# Patient Record
Sex: Female | Born: 1970 | Race: White | Hispanic: No | Smoking: Never smoker
Health system: Southern US, Community
[De-identification: ages and names within clinical notes are randomized; demographics above are authoritative.]

## PROBLEM LIST (undated history)

## (undated) DIAGNOSIS — F419 Anxiety disorder, unspecified: Secondary | ICD-10-CM

## (undated) DIAGNOSIS — I10 Essential (primary) hypertension: Secondary | ICD-10-CM

## (undated) HISTORY — DX: Anxiety disorder, unspecified: F41.9

## (undated) HISTORY — DX: Essential (primary) hypertension: I10

---

## 2006-02-22 ENCOUNTER — Ambulatory Visit: Payer: Self-pay

## 2008-04-10 ENCOUNTER — Ambulatory Visit: Payer: Self-pay | Admitting: Internal Medicine

## 2010-05-17 ENCOUNTER — Ambulatory Visit: Payer: Self-pay | Admitting: Obstetrics and Gynecology

## 2011-06-07 ENCOUNTER — Ambulatory Visit: Payer: Self-pay | Admitting: Obstetrics and Gynecology

## 2012-08-06 ENCOUNTER — Ambulatory Visit: Payer: Self-pay | Admitting: Obstetrics and Gynecology

## 2013-08-07 ENCOUNTER — Ambulatory Visit: Payer: Self-pay | Admitting: Obstetrics and Gynecology

## 2014-01-12 DIAGNOSIS — I10 Essential (primary) hypertension: Secondary | ICD-10-CM | POA: Insufficient documentation

## 2014-01-13 ENCOUNTER — Ambulatory Visit: Payer: Self-pay

## 2014-01-27 ENCOUNTER — Ambulatory Visit: Payer: Self-pay

## 2014-01-27 HISTORY — PX: BREAST BIOPSY: SHX20

## 2014-01-28 LAB — PATHOLOGY REPORT

## 2015-02-25 ENCOUNTER — Other Ambulatory Visit: Payer: Self-pay | Admitting: Nurse Practitioner

## 2015-02-25 DIAGNOSIS — Z1231 Encounter for screening mammogram for malignant neoplasm of breast: Secondary | ICD-10-CM

## 2015-02-25 DIAGNOSIS — N649 Disorder of breast, unspecified: Secondary | ICD-10-CM

## 2015-03-01 ENCOUNTER — Ambulatory Visit
Admission: RE | Admit: 2015-03-01 | Discharge: 2015-03-01 | Disposition: A | Discharge status: Home / Self Care | Payer: BC Managed Care – PPO | Source: Ambulatory Visit | Attending: Nurse Practitioner | Admitting: Nurse Practitioner

## 2015-03-01 ENCOUNTER — Ambulatory Visit: Payer: Self-pay

## 2015-03-01 DIAGNOSIS — N649 Disorder of breast, unspecified: Secondary | ICD-10-CM | POA: Diagnosis not present

## 2015-03-01 DIAGNOSIS — Z1231 Encounter for screening mammogram for malignant neoplasm of breast: Secondary | ICD-10-CM

## 2015-11-02 ENCOUNTER — Other Ambulatory Visit: Payer: Self-pay | Admitting: Obstetrics and Gynecology

## 2015-11-02 DIAGNOSIS — Z1231 Encounter for screening mammogram for malignant neoplasm of breast: Secondary | ICD-10-CM

## 2015-11-03 ENCOUNTER — Other Ambulatory Visit: Payer: Self-pay | Admitting: Obstetrics and Gynecology

## 2015-11-03 DIAGNOSIS — N6002 Solitary cyst of left breast: Secondary | ICD-10-CM

## 2016-03-09 ENCOUNTER — Ambulatory Visit
Admission: RE | Admit: 2016-03-09 | Discharge: 2016-03-09 | Disposition: A | Discharge status: Home / Self Care | Payer: BC Managed Care – PPO | Source: Ambulatory Visit | Attending: Obstetrics and Gynecology | Admitting: Obstetrics and Gynecology

## 2016-03-09 DIAGNOSIS — N6002 Solitary cyst of left breast: Secondary | ICD-10-CM

## 2016-03-09 DIAGNOSIS — Z1231 Encounter for screening mammogram for malignant neoplasm of breast: Secondary | ICD-10-CM | POA: Insufficient documentation

## 2016-12-20 ENCOUNTER — Other Ambulatory Visit: Payer: Self-pay | Admitting: Obstetrics and Gynecology

## 2016-12-20 DIAGNOSIS — Z1231 Encounter for screening mammogram for malignant neoplasm of breast: Secondary | ICD-10-CM

## 2017-03-12 ENCOUNTER — Ambulatory Visit
Admission: RE | Admit: 2017-03-12 | Discharge: 2017-03-12 | Disposition: A | Discharge status: Home / Self Care | Payer: BC Managed Care – PPO | Source: Ambulatory Visit | Attending: Obstetrics and Gynecology | Admitting: Obstetrics and Gynecology

## 2017-03-12 DIAGNOSIS — Z1231 Encounter for screening mammogram for malignant neoplasm of breast: Secondary | ICD-10-CM | POA: Diagnosis present

## 2017-10-04 ENCOUNTER — Other Ambulatory Visit: Payer: Self-pay | Admitting: Nurse Practitioner

## 2017-10-04 DIAGNOSIS — F411 Generalized anxiety disorder: Secondary | ICD-10-CM

## 2017-10-04 MED ORDER — ALPRAZOLAM 0.25 MG PO TABS: 0.2500 mg | ORAL_TABLET | Freq: Two times a day (BID) | ORAL | 2 refills | Status: DC | PRN

## 2017-10-04 NOTE — Progress Notes (Signed)
Added alprazolam 0.25mg  twice daily as needed for anxiety. Sent new rx to [facility] [address].

## 2017-10-29 ENCOUNTER — Other Ambulatory Visit: Payer: Self-pay

## 2017-10-29 MED ORDER — VALSARTAN 160 MG PO TABS: 160.0000 mg | ORAL_TABLET | Freq: Every day | ORAL | 5 refills | Status: DC

## 2017-12-17 ENCOUNTER — Other Ambulatory Visit: Payer: Self-pay

## 2017-12-17 MED ORDER — VALSARTAN 160 MG PO TABS: 160.0000 mg | ORAL_TABLET | Freq: Every day | ORAL | 5 refills | Status: DC

## 2018-04-09 ENCOUNTER — Other Ambulatory Visit: Payer: Self-pay | Admitting: Obstetrics and Gynecology

## 2018-04-09 DIAGNOSIS — Z1231 Encounter for screening mammogram for malignant neoplasm of breast: Secondary | ICD-10-CM

## 2018-05-14 ENCOUNTER — Ambulatory Visit
Admission: RE | Admit: 2018-05-14 | Discharge: 2018-05-14 | Disposition: A | Discharge status: Home / Self Care | Payer: BC Managed Care – PPO | Source: Ambulatory Visit | Attending: Obstetrics and Gynecology | Admitting: Obstetrics and Gynecology

## 2018-05-14 DIAGNOSIS — Z1231 Encounter for screening mammogram for malignant neoplasm of breast: Secondary | ICD-10-CM | POA: Diagnosis not present

## 2018-12-21 IMAGING — MG MM DIGITAL SCREENING BILAT W/ TOMO W/ CAD
8 series · 9 of 24 positions shown · non-contrast
Comparison: Previous exam(s).

CLINICAL DATA: Screening.

EXAM:
DIGITAL SCREENING BILATERAL MAMMOGRAM WITH TOMO AND CAD

[R MLO synth-2D]
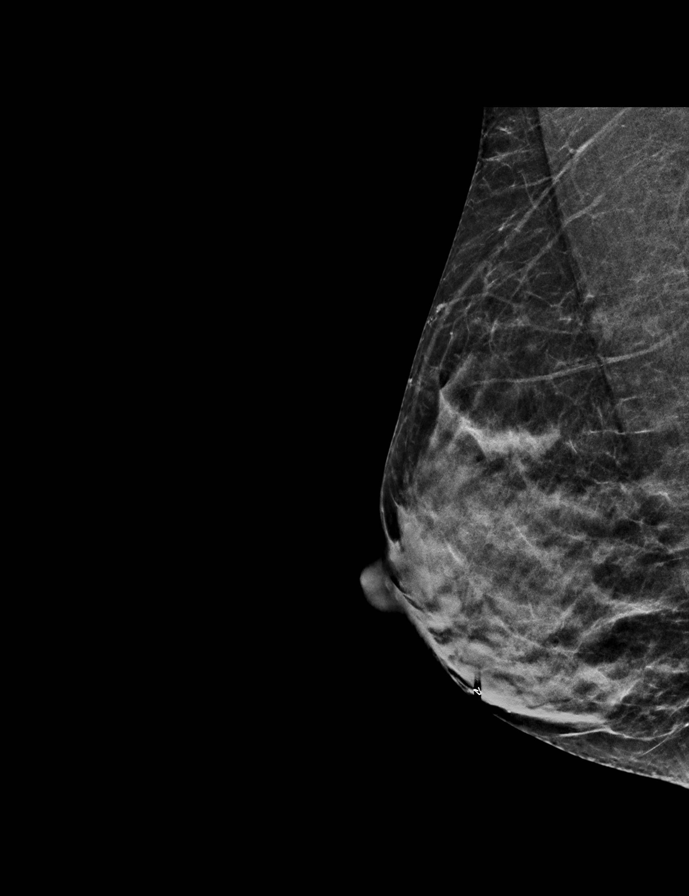

[R CC synth-2D]
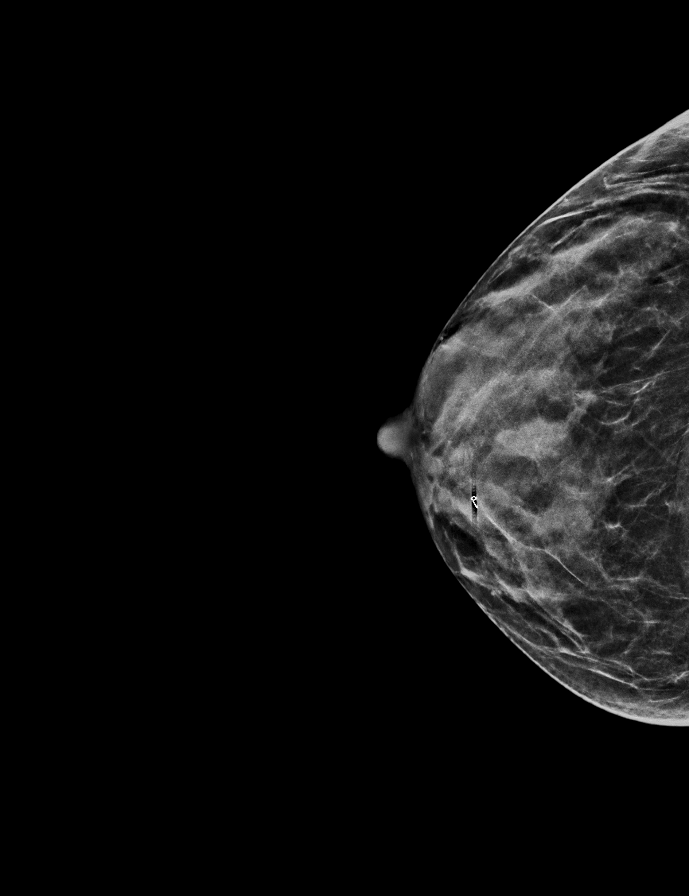

[L CC synth-2D]
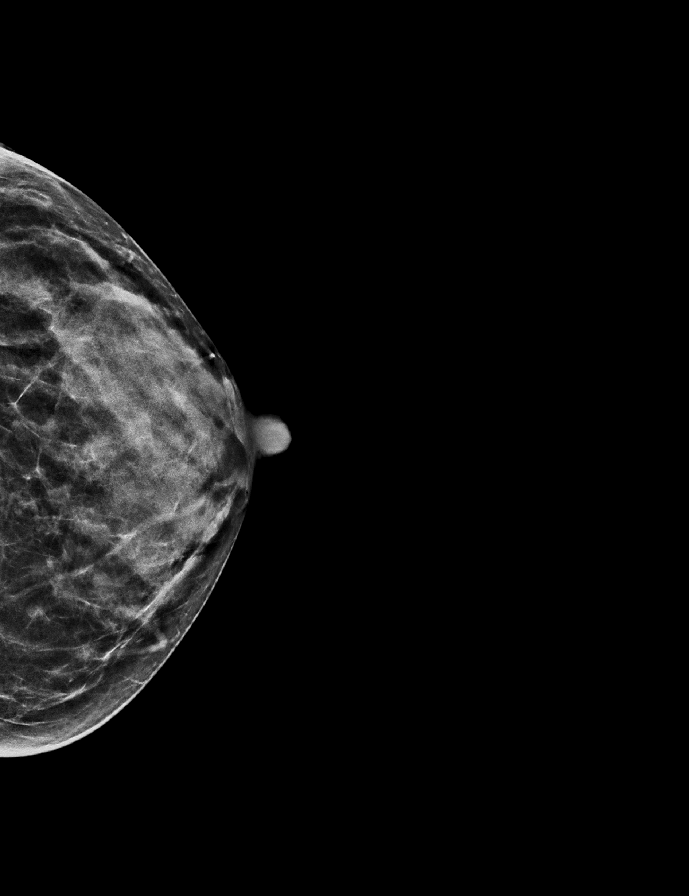

[L MLO synth-2D]
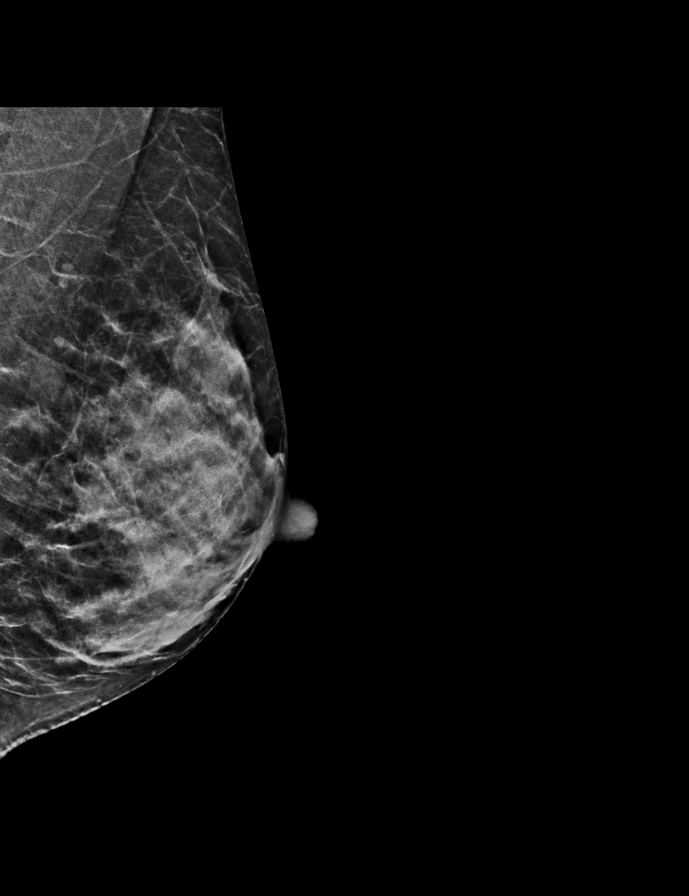

[R MLO tomo · 2 of 42 frames shown]
[frame 14/42]
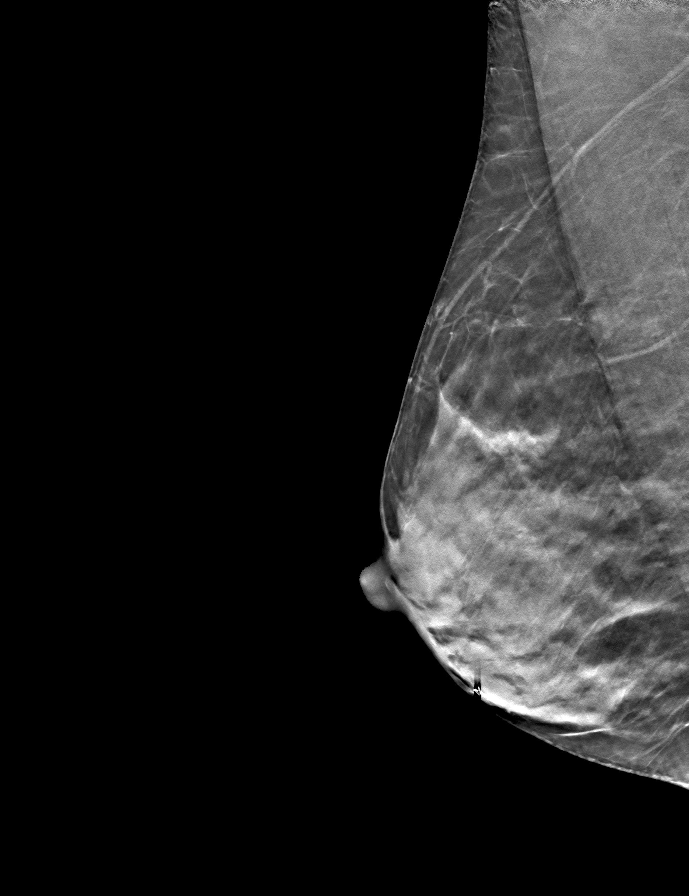
[frame 21/42]
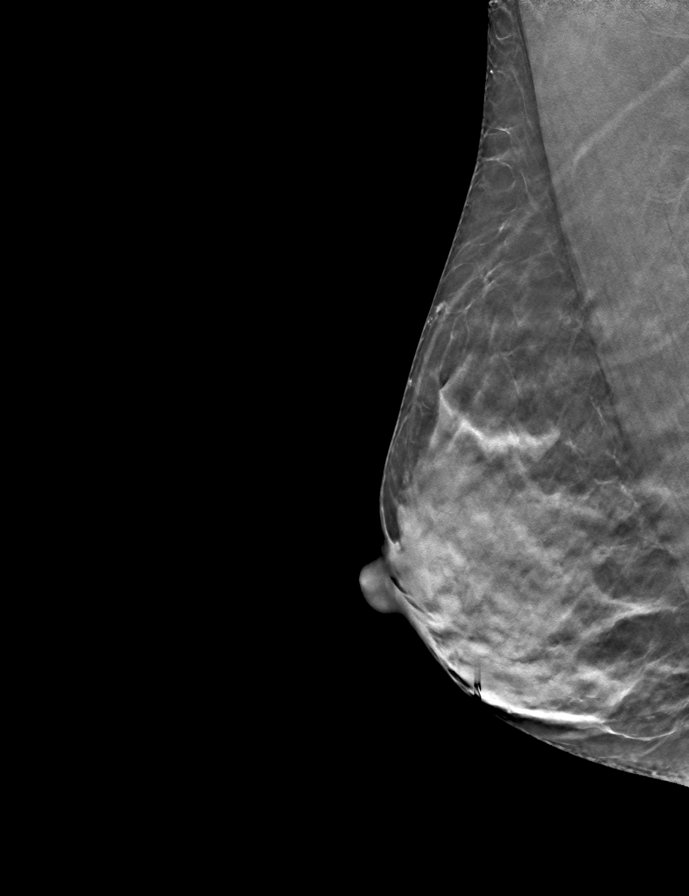

[R CC tomo · tomo slice 22/43.0]
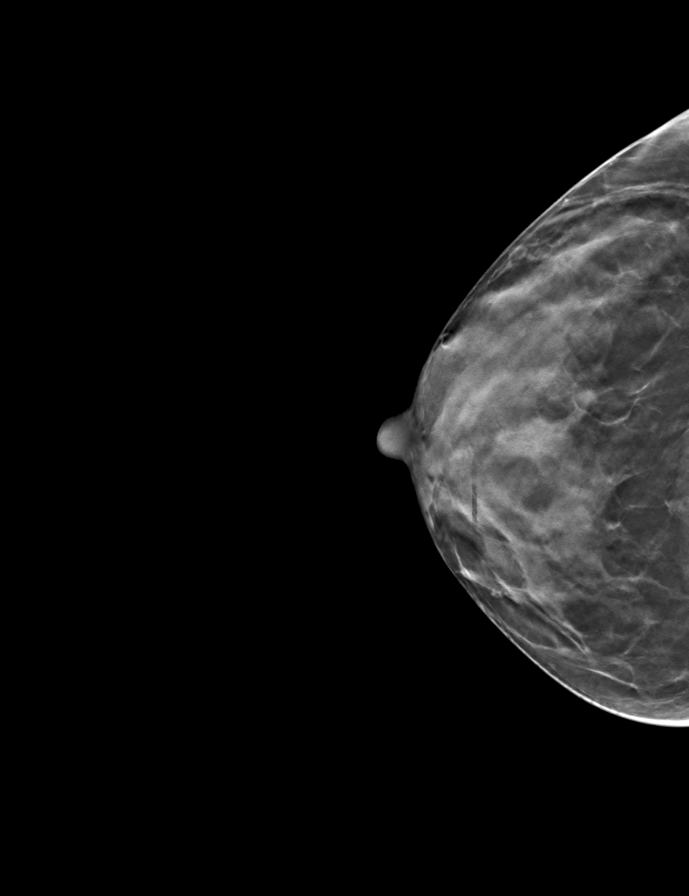

[L MLO tomo · tomo slice 23/44.0]
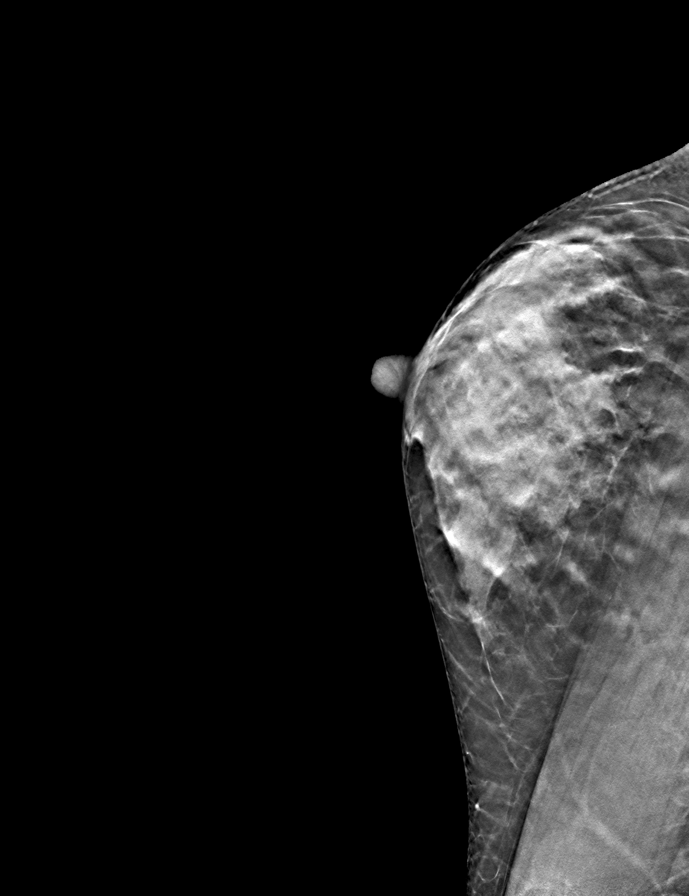

[L CC tomo · tomo slice 23/44.0]
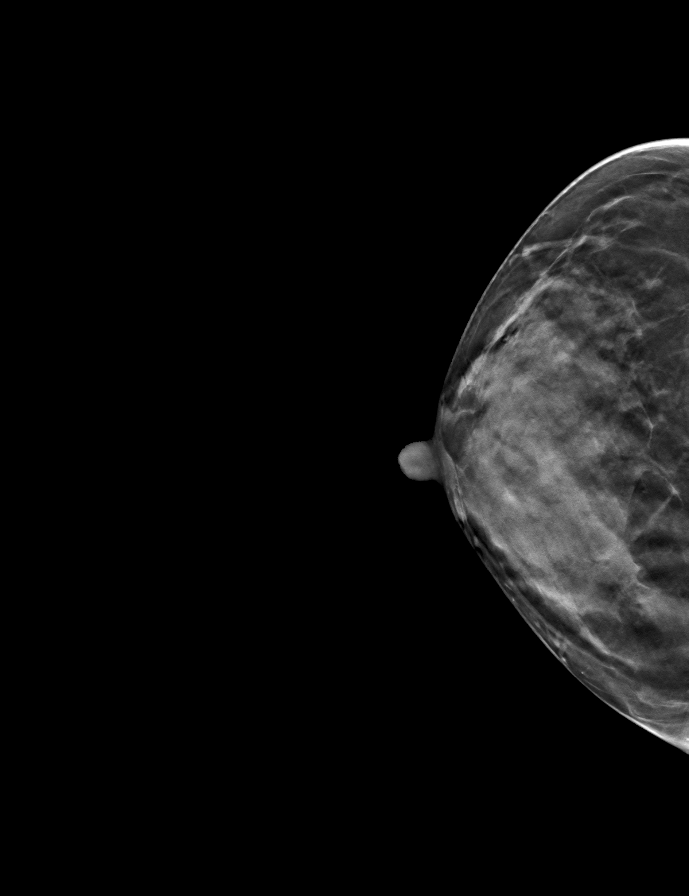

[9 of 24 positions shown; findings below may reference images not displayed]

ACR Breast Density Category c: The breast tissue is heterogeneously
dense, which may obscure small masses.
FINDINGS: There are no findings suspicious for malignancy. Images were
processed with CAD.
IMPRESSION: No mammographic evidence of malignancy. A result letter of this
screening mammogram will be mailed directly to the patient.

RECOMMENDATION:
Screening mammogram in one year. (Code:FT-U-LHB)

BI-RADS CATEGORY  1: Negative.

## 2019-01-23 ENCOUNTER — Telehealth: Payer: Self-pay

## 2019-01-23 ENCOUNTER — Other Ambulatory Visit: Payer: Self-pay | Admitting: Adult Health

## 2019-01-23 MED ORDER — VALSARTAN 160 MG PO TABS: 160.0000 mg | ORAL_TABLET | Freq: Every day | ORAL | 0 refills | Status: DC

## 2019-01-23 NOTE — Telephone Encounter (Signed)
Pt called to get refill on her valsartan. Scheduled appt for patient and sent in 14 day fill for her valsartan. Called pt to notify her that medication was sent and lmom.

## 2019-01-23 NOTE — Telephone Encounter (Signed)
lmom several message pt need appt for further refills we still her pcp phar keep sending refills request pt not seen since 2018 and spoke with phar pt need to seen  If she is still our pt

## 2019-02-05 ENCOUNTER — Ambulatory Visit: Payer: BC Managed Care – PPO | Admitting: Nurse Practitioner

## 2019-02-05 ENCOUNTER — Encounter: Payer: Self-pay | Admitting: Adult Health

## 2019-02-05 ENCOUNTER — Other Ambulatory Visit: Payer: Self-pay

## 2019-02-05 DIAGNOSIS — F411 Generalized anxiety disorder: Secondary | ICD-10-CM

## 2019-02-05 DIAGNOSIS — I10 Essential (primary) hypertension: Secondary | ICD-10-CM | POA: Diagnosis not present

## 2019-02-05 MED ORDER — VALSARTAN 160 MG PO TABS: 160.0000 mg | ORAL_TABLET | Freq: Every day | ORAL | 5 refills | Status: DC

## 2019-02-05 MED ORDER — ALPRAZOLAM 0.25 MG PO TABS: 0.2500 mg | ORAL_TABLET | Freq: Two times a day (BID) | ORAL | 3 refills | Status: AC | PRN

## 2019-02-05 NOTE — Progress Notes (Signed)
[facility] [address] [place], [place] [address]  Internal MEDICINE  Telephone Visit  Patient Name: Deanna NeedyMartha O Vallo  [id number]  [id number]  Date of Service: 02/05/2019  I connected with the patient at 9:08am by webcam and verified the patients identity using two identifiers.   I discussed the limitations, risks, security and privacy concerns of performing an evaluation and management service by webcam and the availability of in person appointments. I also discussed with the patient that there may be a patient responsible charge related to the service.  The patient expressed understanding and agrees to proceed.    Chief Complaint  Patient presents with  . Telephone Screen  . Medical Management of Chronic Issues    MEDICATION REFILLS   . Anxiety  . Telephone Assessment    The patient has been contacted via webcam for follow up visit due to concerns for spread of novel coronavirus.  The patient is following up for hypertension. She needs refills for her medications. States that she is dong very well on the current dose of losartan. She continues to take alprazolam 0.25mg  tablets on as needed basis. She does not need refilll for today, however,her prescription is long expired. Today, she states that she feels well and has no new concerns or complaints.       Current Medication: Outpatient Encounter Medications as of 02/05/2019  Medication Sig  . ALPRAZolam (XANAX) 0.25 MG tablet Take 1 tablet (0.25 mg total) by mouth 2 (two) times daily as needed for anxiety.  . valsartan (DIOVAN) 160 MG tablet Take 1 tablet (160 mg total) by mouth daily.  . [DISCONTINUED] ALPRAZolam (XANAX) 0.25 MG tablet Take 1 tablet (0.25 mg total) by mouth 2 (two) times daily as needed for anxiety.  . [DISCONTINUED] valsartan (DIOVAN) 160 MG tablet Take 1 tablet (160 mg total) by mouth daily.   No facility-administered encounter medications on file as of 02/05/2019.     Surgical History: Past  Surgical History:  Procedure Laterality Date  . BREAST BIOPSY Right 01/27/2014   neg cyst    Medical History: Past Medical History:  Diagnosis Date  . Anxiety   . Hypertension     Family History: Family History  Problem Relation Age of Onset  . Breast cancer Maternal Aunt 70  . Breast cancer Other     Social History   Socioeconomic History  . Marital status: Divorced    Spouse name: Not on file  . Number of children: Not on file  . Years of education: Not on file  . Highest education level: Not on file  Occupational History  . Not on file  Social Needs  . Financial resource strain: Not on file  . Food insecurity    Worry: Not on file    Inability: Not on file  . Transportation needs    Medical: Not on file    Non-medical: Not on file  Tobacco Use  . Smoking status: Never Smoker  . Smokeless tobacco: Never Used  Substance and Sexual Activity  . Alcohol use: Yes    Comment: SOCIALLY  . Drug use: Never  . Sexual activity: Not on file  Lifestyle  . Physical activity    Days per week: Not on file    Minutes per session: Not on file  . Stress: Not on file  Relationships  . Social Musicianconnections    Talks on phone: Not on file    Gets together: Not on file    Attends religious  service: Not on file    Active member of club or organization: Not on file    Attends meetings of clubs or organizations: Not on file    Relationship status: Not on file  . Intimate partner violence    Fear of current or ex partner: Not on file    Emotionally abused: Not on file    Physically abused: Not on file    Forced sexual activity: Not on file  Other Topics Concern  . Not on file  Social History Narrative  . Not on file      Review of Systems  Constitutional: Negative for activity change, chills, fatigue and unexpected weight change.  HENT: Negative for congestion, postnasal drip, rhinorrhea, sneezing and sore throat.   Respiratory: Negative for cough, chest tightness and  shortness of breath.   Cardiovascular: Negative for chest pain and palpitations.       Blood pressure is well controlled on current medication.   Gastrointestinal: Negative for abdominal pain, constipation, diarrhea, nausea and vomiting.  Musculoskeletal: Negative for arthralgias, back pain, joint swelling and neck pain.  Skin: Negative for rash.  Neurological: Negative for tremors and numbness.  Hematological: Negative for adenopathy. Does not bruise/bleed easily.  Psychiatric/Behavioral: Negative for behavioral problems (Depression), sleep disturbance and suicidal ideas. The patient is nervous/anxious.        Mild, intermittent anxiety.     Vital Signs: There were no vitals taken for this visit.   Observation/Objective:   The patient is alert and oriented. She is pleasant and answers all questions appropriately. Breathing is non-labored. She is in no acute distress at this time.    Assessment/Plan: 1. Essential hypertension Stable. Continue bp medicatin as prescribed  - valsartan (DIOVAN) 160 MG tablet; Take 1 tablet (160 mg total) by mouth daily.  Dispense: 30 tablet; Refill: 5  2. Generalized anxiety disorder May continue to take alprazolam 0.25mg  twice daily if needed for acute anxiety. Refills provided today because current prescription expired.  - ALPRAZolam (XANAX) 0.25 MG tablet; Take 1 tablet (0.25 mg total) by mouth 2 (two) times daily as needed for anxiety.  Dispense: 60 tablet; Refill: 3  General Counseling: helayna dun understanding of the findings of today's phone visit and agrees with plan of treatment. I have discussed any further diagnostic evaluation that may be needed or ordered today. We also reviewed her medications today. she has been encouraged to call the office with any questions or concerns that should arise related to todays visit.   Hypertension Counseling:   The following hypertensive lifestyle modification were recommended and discussed:  1.  Limiting alcohol intake to less than 1 oz/day of ethanol:(24 oz of beer or 8 oz of wine or 2 oz of 100-proof whiskey). 2. Take baby ASA 81 mg daily. 3. Importance of regular aerobic exercise and losing weight. 4. Reduce dietary saturated fat and cholesterol intake for overall cardiovascular health. 5. Maintaining adequate dietary potassium, calcium, and magnesium intake. 6. Regular monitoring of the blood pressure. 7. Reduce sodium intake to less than 100 mmol/day (less than 2.3 gm of sodium or less than 6 gm of sodium choride)   This patient was seen by [place] with Dr Lavera Guise as a part of collaborative care agreement  Meds ordered this encounter  Medications  . ALPRAZolam (XANAX) 0.25 MG tablet    Sig: Take 1 tablet (0.25 mg total) by mouth 2 (two) times daily as needed for anxiety.    Dispense:  60  tablet    Refill:  3    Order Specific Question:   Supervising Provider    Answer:   Lyndon CodeKHAN, FOZIA M [[id number]]  . valsartan (DIOVAN) 160 MG tablet    Sig: Take 1 tablet (160 mg total) by mouth daily.    Dispense:  30 tablet    Refill:  5    Pt needs an appt for further refills    Order Specific Question:   Supervising Provider    Answer:   Lyndon CodeKHAN, FOZIA M [[id number]]    Time spent: 6815 Minutes    Dr Lyndon CodeFozia M Khan Internal medicine

## 2019-04-16 ENCOUNTER — Other Ambulatory Visit: Payer: Self-pay | Admitting: *Deleted

## 2019-04-16 DIAGNOSIS — Z20822 Contact with and (suspected) exposure to covid-19: Secondary | ICD-10-CM

## 2019-04-17 LAB — NOVEL CORONAVIRUS, NAA: SARS-CoV-2, NAA: NOT DETECTED

## 2019-06-04 ENCOUNTER — Other Ambulatory Visit: Payer: Self-pay | Admitting: Nurse Practitioner

## 2019-06-04 NOTE — Telephone Encounter (Signed)
Updated meds

## 2019-08-11 ENCOUNTER — Ambulatory Visit: Payer: BC Managed Care – PPO | Admitting: Nurse Practitioner

## 2019-08-12 ENCOUNTER — Telehealth: Payer: Self-pay

## 2019-08-12 NOTE — Telephone Encounter (Signed)
CONFIRMED 08-14-19 OV AS VIRTUAL. 

## 2019-08-14 ENCOUNTER — Ambulatory Visit (INDEPENDENT_AMBULATORY_CARE_PROVIDER_SITE_OTHER): Payer: BC Managed Care – PPO | Admitting: Nurse Practitioner

## 2019-08-14 ENCOUNTER — Encounter: Payer: Self-pay | Admitting: Nurse Practitioner

## 2019-08-14 VITALS — Ht 67.0 in

## 2019-08-14 DIAGNOSIS — I10 Essential (primary) hypertension: Secondary | ICD-10-CM | POA: Diagnosis not present

## 2019-08-14 DIAGNOSIS — F411 Generalized anxiety disorder: Secondary | ICD-10-CM | POA: Diagnosis not present

## 2019-08-14 NOTE — Progress Notes (Signed)
[facility] [address] [place], [place] [address]  Internal MEDICINE  Telephone Visit  Patient Name: Deanna Hardin  [id number]  [id number]  Date of Service: 08/14/2019  I connected with the patient at 4:09pm by webcam and verified the patients identity using two identifiers.   I discussed the limitations, risks, security and privacy concerns of performing an evaluation and management service by webcam and the availability of in person appointments. I also discussed with the patient that there may be a patient responsible charge related to the service.  The patient expressed understanding and agrees to proceed.    Chief Complaint  Patient presents with  . Telephone Assessment  . Telephone Screen  . Hypertension    The patient has been contacted via webcam for follow up visit due to concerns for spread of novel coronavirus. The patient presents for follow up visit. She has no concerns or complaints today. She states that she has not had a menstrual cycle for past two months. Currently on norethindrone. Does see GYN for well woman care and pap smears. She takes lexapro daily. She does have a prescription for alprazolam 0.25mg  to take as needed. The lat time she had this filled was in July, 2020. She states she does not need a refill for this right now.       Current Medication: Outpatient Encounter Medications as of 08/14/2019  Medication Sig  . ALPRAZolam (XANAX) 0.25 MG tablet Take 1 tablet (0.25 mg total) by mouth 2 (two) times daily as needed for anxiety.  Marland Kitchen escitalopram (LEXAPRO) 10 MG tablet Take by mouth.  . norethindrone (MICRONOR) 0.35 MG tablet Take 1 tablet by mouth daily.  . valsartan (DIOVAN) 160 MG tablet Take 1 tablet (160 mg total) by mouth daily.   No facility-administered encounter medications on file as of 08/14/2019.    Surgical History: Past Surgical History:  Procedure Laterality Date  . BREAST BIOPSY Right 01/27/2014   neg cyst    Medical  History: Past Medical History:  Diagnosis Date  . Anxiety   . Hypertension     Family History: Family History  Problem Relation Age of Onset  . Breast cancer Maternal Aunt 70  . Breast cancer Other     Social History   Socioeconomic History  . Marital status: Divorced    Spouse name: Not on file  . Number of children: Not on file  . Years of education: Not on file  . Highest education level: Not on file  Occupational History  . Not on file  Tobacco Use  . Smoking status: Never Smoker  . Smokeless tobacco: Never Used  Substance and Sexual Activity  . Alcohol use: Yes    Comment: SOCIALLY  . Drug use: Never  . Sexual activity: Not on file  Other Topics Concern  . Not on file  Social History Narrative  . Not on file   Social Determinants of Health   Financial Resource Strain:   . Difficulty of Paying Living Expenses: Not on file  Food Insecurity:   . Worried About Programme researcher, broadcasting/film/video in the Last Year: Not on file  . Ran Out of Food in the Last Year: Not on file  Transportation Needs:   . Lack of Transportation (Medical): Not on file  . Lack of Transportation (Non-Medical): Not on file  Physical Activity:   . Days of Exercise per Week: Not on file  . Minutes of Exercise per Session: Not on file  Stress:   .  Feeling of Stress : Not on file  Social Connections:   . Frequency of Communication with Friends and Family: Not on file  . Frequency of Social Gatherings with Friends and Family: Not on file  . Attends Religious Services: Not on file  . Active Member of Clubs or Organizations: Not on file  . Attends Archivist Meetings: Not on file  . Marital Status: Not on file  Intimate Partner Violence:   . Fear of Current or Ex-Partner: Not on file  . Emotionally Abused: Not on file  . Physically Abused: Not on file  . Sexually Abused: Not on file      Review of Systems  Constitutional: Negative for activity change, chills, fatigue and unexpected  weight change.  HENT: Negative for congestion, postnasal drip, rhinorrhea, sneezing and sore throat.   Respiratory: Negative for cough, chest tightness and shortness of breath.   Cardiovascular: Negative for chest pain and palpitations.       Blood pressure is well controlled on current medication.   Gastrointestinal: Negative for abdominal pain, constipation, diarrhea, nausea and vomiting.  Musculoskeletal: Negative for arthralgias, back pain, joint swelling and neck pain.  Skin: Negative for rash.  Neurological: Negative for tremors and numbness.  Hematological: Negative for adenopathy. Does not bruise/bleed easily.  Psychiatric/Behavioral: Negative for behavioral problems (Depression), sleep disturbance and suicidal ideas. The patient is nervous/anxious.        Mild, intermittent anxiety.     Today's Vitals   08/14/19 1549  Height: 5\' 7"  (1.702 m)   There is no height or weight on file to calculate BMI.   Observation/Objective:   The patient is alert and oriented. She is pleasant and answers all questions appropriately. Breathing is non-labored. She is in no acute distress at this time.    Assessment/Plan:  1. Essential hypertension Stable. Continue bp medication as prescribed   2. Generalized anxiety disorder Well managed. Continue lexapro daily. May take alprazolam 0.25mg  as needed and as prescribed. Patient will call if and when she needs new prescription.   General Counseling: emmali karow understanding of the findings of today's phone visit and agrees with plan of treatment. I have discussed any further diagnostic evaluation that may be needed or ordered today. We also reviewed her medications today. she has been encouraged to call the office with any questions or concerns that should arise related to todays visit.  This patient was seen by Leretha Pol FNP Collaboration with Dr Lavera Guise as a part of collaborative care agreement  Time spent: 45  Minutes    Dr Lavera Guise Internal medicine

## 2019-11-24 ENCOUNTER — Telehealth: Payer: Self-pay

## 2019-11-24 NOTE — Telephone Encounter (Signed)
Confirmed appointment on 11/25/2019 and screened for covid. klh °

## 2019-11-25 ENCOUNTER — Other Ambulatory Visit: Payer: Self-pay

## 2019-11-25 ENCOUNTER — Encounter: Payer: Self-pay | Admitting: Nurse Practitioner

## 2019-11-25 ENCOUNTER — Ambulatory Visit: Payer: BC Managed Care – PPO | Admitting: Adult Health

## 2019-11-25 VITALS — BP 131/86 | HR 84 | Temp 97.7°F | Ht 67.0 in | Wt 130.4 lb

## 2019-11-25 DIAGNOSIS — M25521 Pain in right elbow: Secondary | ICD-10-CM | POA: Diagnosis not present

## 2019-11-25 DIAGNOSIS — I10 Essential (primary) hypertension: Secondary | ICD-10-CM

## 2019-11-25 MED ORDER — MELOXICAM 15 MG PO TABS: 15.0000 mg | ORAL_TABLET | Freq: Every day | ORAL | 0 refills | Status: DC

## 2019-11-25 NOTE — Progress Notes (Signed)
[facility] [address] [place], [place] [address]  Internal MEDICINE  Office Visit Note  Patient Name: Deanna Hardin  [id number]  [id number]  Date of Service: 11/25/2019  Chief Complaint  Patient presents with  . Tendonitis    right arm, started end of march and continued      HPI Pt is here for a sick visit. Pt reports right elbow pain that she noticed a few months ago. 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  She reports the pain has not gotten worse, she denies any issues with ROM.     Current Medication:  Outpatient Encounter Medications as of 11/25/2019  Medication Sig  . ALPRAZolam (XANAX) 0.25 MG tablet Take 1 tablet (0.25 mg total) by mouth 2 (two) times daily as needed for anxiety.  Marland Kitchen escitalopram (LEXAPRO) 10 MG tablet Take by mouth.  . valsartan (DIOVAN) 160 MG tablet Take 1 tablet (160 mg total) by mouth daily.  . meloxicam (MOBIC) 15 MG tablet Take 1 tablet (15 mg total) by mouth daily.  . [DISCONTINUED] norethindrone (MICRONOR) 0.35 MG tablet Take 1 tablet by mouth daily.   No facility-administered encounter medications on file as of 11/25/2019.      Medical History: Past Medical History:  Diagnosis Date  . Anxiety   . Hypertension      Vital Signs: BP 131/86   Pulse 84   Temp 97.7 F (36.5 C)   Ht 5\' 7"  (1.702 m)   Wt 130 lb 6.4 oz (59.1 kg)   SpO2 99%   BMI 20.42 kg/m    Review of Systems  Constitutional: Negative for chills, fatigue and unexpected weight change.  HENT: Negative for congestion, rhinorrhea, sneezing and sore throat.   Eyes: Negative for photophobia, pain and redness.  Respiratory: Negative for cough, chest tightness and shortness of breath.   Cardiovascular: Negative for chest pain and palpitations.  Gastrointestinal: Negative for abdominal pain, constipation, diarrhea, nausea and vomiting.  Endocrine: Negative.   Genitourinary: Negative for dysuria and frequency.   Musculoskeletal: Negative for arthralgias, back pain, joint swelling and neck pain.  Skin: Negative for rash.  Allergic/Immunologic: Negative.   Neurological: Negative for tremors and numbness.  Hematological: Negative for adenopathy. Does not bruise/bleed easily.  Psychiatric/Behavioral: Negative for behavioral problems and sleep disturbance. The patient is not nervous/anxious.     Physical Exam Vitals and nursing note reviewed.  Constitutional:      General: She is not in acute distress.    Appearance: She is well-developed. She is not diaphoretic.  HENT:     Head: Normocephalic and atraumatic.     Mouth/Throat:     Pharynx: No oropharyngeal exudate.  Eyes:     Pupils: Pupils are equal, round, and reactive to light.  Neck:     Thyroid: No thyromegaly.     Vascular: No JVD.     Trachea: No tracheal deviation.  Cardiovascular:     Rate and Rhythm: Normal rate and regular rhythm.     Heart sounds: Normal heart sounds. No murmur. No friction rub. No gallop.   Pulmonary:     Effort: Pulmonary effort is normal. No respiratory distress.     Breath sounds: Normal breath sounds. No wheezing or rales.  Chest:     Chest wall: No tenderness.  Abdominal:     Palpations: Abdomen is soft.     Tenderness: There is no abdominal tenderness. There is no guarding.  Musculoskeletal:  General: Normal range of motion.     Cervical back: Normal range of motion and neck supple.  Lymphadenopathy:     Cervical: No cervical adenopathy.  Skin:    General: Skin is warm and dry.  Neurological:     Mental Status: She is alert and oriented to person, place, and time.     Cranial Nerves: No cranial nerve deficit.  Psychiatric:        Behavior: Behavior normal.        Thought Content: Thought content normal.        Judgment: Judgment normal.     Assessment/Plan: 1. Elbow pain, right Try anti-inflamatory for two weeks.  Discussed prednisone dose pack, patient would like to hold off for  now.  - meloxicam (MOBIC) 15 MG tablet; Take 1 tablet (15 mg total) by mouth daily.  Dispense: 30 tablet; Refill: 0  2. Essential hypertension Stable, continue present management.   General Counseling: channah godeaux understanding of the findings of todays visit and agrees with plan of treatment. I have discussed any further diagnostic evaluation that may be needed or ordered today. We also reviewed her medications today. she has been encouraged to call the office with any questions or concerns that should arise related to todays visit.   No orders of the defined types were placed in this encounter.   Meds ordered this encounter  Medications  . meloxicam (MOBIC) 15 MG tablet    Sig: Take 1 tablet (15 mg total) by mouth daily.    Dispense:  30 tablet    Refill:  0    Time spent: 25 Minutes  This patient was seen by Orson Gear AGNP-C in Collaboration with Dr Lavera Guise as a part of collaborative care agreement.  Kendell Bane AGNP-C Internal Medicine

## 2019-12-19 ENCOUNTER — Other Ambulatory Visit: Payer: Self-pay | Admitting: Adult Health

## 2019-12-19 DIAGNOSIS — M25521 Pain in right elbow: Secondary | ICD-10-CM

## 2020-02-12 ENCOUNTER — Ambulatory Visit: Payer: BC Managed Care – PPO | Admitting: Nurse Practitioner

## 2020-02-22 ENCOUNTER — Encounter: Payer: Self-pay | Admitting: Nurse Practitioner

## 2020-02-24 ENCOUNTER — Telehealth: Payer: Self-pay

## 2020-02-24 NOTE — Telephone Encounter (Signed)
Lmom to confirm and screen for 02-26-20 ov. 

## 2020-02-25 ENCOUNTER — Other Ambulatory Visit: Payer: Self-pay

## 2020-02-25 DIAGNOSIS — I10 Essential (primary) hypertension: Secondary | ICD-10-CM

## 2020-02-25 MED ORDER — VALSARTAN 160 MG PO TABS: 160.0000 mg | ORAL_TABLET | Freq: Every day | ORAL | 5 refills | Status: DC

## 2020-02-26 ENCOUNTER — Encounter: Payer: Self-pay | Admitting: Adult Health

## 2020-02-26 ENCOUNTER — Telehealth: Payer: Self-pay

## 2020-02-26 ENCOUNTER — Ambulatory Visit: Payer: BC Managed Care – PPO | Admitting: Adult Health

## 2020-02-26 ENCOUNTER — Ambulatory Visit: Payer: BC Managed Care – PPO | Admitting: Nurse Practitioner

## 2020-02-26 ENCOUNTER — Other Ambulatory Visit: Payer: Self-pay

## 2020-02-26 VITALS — BP 122/74 | HR 75 | Temp 97.6°F | Resp 16 | Ht 67.0 in | Wt 129.6 lb

## 2020-02-26 DIAGNOSIS — M25521 Pain in right elbow: Secondary | ICD-10-CM | POA: Diagnosis not present

## 2020-02-26 DIAGNOSIS — I10 Essential (primary) hypertension: Secondary | ICD-10-CM | POA: Diagnosis not present

## 2020-02-26 DIAGNOSIS — F411 Generalized anxiety disorder: Secondary | ICD-10-CM

## 2020-02-26 MED ORDER — MELOXICAM 15 MG PO TABS: 15.0000 mg | ORAL_TABLET | Freq: Every day | ORAL | 1 refills | Status: AC

## 2020-02-26 MED ORDER — VALSARTAN 160 MG PO TABS: 160.0000 mg | ORAL_TABLET | Freq: Every day | ORAL | 1 refills | Status: AC

## 2020-02-26 MED ORDER — ESCITALOPRAM OXALATE 10 MG PO TABS: 10.0000 mg | ORAL_TABLET | Freq: Every day | ORAL | 1 refills | Status: AC

## 2020-02-26 NOTE — Progress Notes (Signed)
[facility] [address] [place], [place] [address]  Internal MEDICINE  Office Visit Note  Patient Name: Deanna Hardin  [id number]  [id number]  Date of Service: 03/16/2020  Chief Complaint  Patient presents with  . Follow-up  . Hypertension  . Quality Metric Gaps    tdap    HPI   Pt is here for follow up. Moving to [place] next week.  She is requesting refills on her medicaitons until she is able to get in to see a doctor there.  She denies any current issues.  No new or worsening symptoms.      Current Medication: Outpatient Encounter Medications as of 02/26/2020  Medication Sig  . ALPRAZolam (XANAX) 0.25 MG tablet Take 1 tablet (0.25 mg total) by mouth 2 (two) times daily as needed for anxiety.  Marland Kitchen escitalopram (LEXAPRO) 10 MG tablet Take 1 tablet (10 mg total) by mouth daily.  . meloxicam (MOBIC) 15 MG tablet Take 1 tablet (15 mg total) by mouth daily.  . valsartan (DIOVAN) 160 MG tablet Take 1 tablet (160 mg total) by mouth daily.  . [DISCONTINUED] escitalopram (LEXAPRO) 10 MG tablet Take by mouth.  . [DISCONTINUED] meloxicam (MOBIC) 15 MG tablet TAKE 1 TABLET BY MOUTH EVERY DAY  . [DISCONTINUED] valsartan (DIOVAN) 160 MG tablet Take 1 tablet (160 mg total) by mouth daily.   No facility-administered encounter medications on file as of 02/26/2020.    Surgical History: Past Surgical History:  Procedure Laterality Date  . BREAST BIOPSY Right 01/27/2014   neg cyst    Medical History: Past Medical History:  Diagnosis Date  . Anxiety   . Hypertension     Family History: Family History  Problem Relation Age of Onset  . Breast cancer Maternal Aunt 70  . Breast cancer Other     Social History   Socioeconomic History  . Marital status: Divorced    Spouse name: Not on file  . Number of children: Not on file  . Years of education: Not on file  . Highest education level: Not on file  Occupational History  . Not on file  Tobacco Use  .  Smoking status: Never Smoker  . Smokeless tobacco: Never Used  Vaping Use  . Vaping Use: Never used  Substance and Sexual Activity  . Alcohol use: Yes    Comment: SOCIALLY  . Drug use: Never  . Sexual activity: Not on file  Other Topics Concern  . Not on file  Social History Narrative  . Not on file   Social Determinants of Health   Financial Resource Strain:   . Difficulty of Paying Living Expenses:   Food Insecurity:   . Worried About Programme researcher, broadcasting/film/video in the Last Year:   . Barista in the Last Year:   Transportation Needs:   . Freight forwarder (Medical):   Marland Kitchen Lack of Transportation (Non-Medical):   Physical Activity:   . Days of Exercise per Week:   . Minutes of Exercise per Session:   Stress:   . Feeling of Stress :   Social Connections:   . Frequency of Communication with Friends and Family:   . Frequency of Social Gatherings with Friends and Family:   . Attends Religious Services:   . Active Member of Clubs or Organizations:   . Attends Banker Meetings:   Marland Kitchen Marital Status:   Intimate Partner Violence:   . Fear of Current or Ex-Partner:   .  Emotionally Abused:   Marland Kitchen Physically Abused:   . Sexually Abused:       Review of Systems  Constitutional: Negative for chills, fatigue and unexpected weight change.  HENT: Negative for congestion, rhinorrhea, sneezing and sore throat.   Eyes: Negative for photophobia, pain and redness.  Respiratory: Negative for cough, chest tightness and shortness of breath.   Cardiovascular: Negative for chest pain and palpitations.  Gastrointestinal: Negative for abdominal pain, constipation, diarrhea, nausea and vomiting.  Endocrine: Negative.   Genitourinary: Negative for dysuria and frequency.  Musculoskeletal: Negative for arthralgias, back pain, joint swelling and neck pain.  Skin: Negative for rash.  Allergic/Immunologic: Negative.   Neurological: Negative for tremors and numbness.  Hematological:  Negative for adenopathy. Does not bruise/bleed easily.  Psychiatric/Behavioral: Negative for behavioral problems and sleep disturbance. The patient is not nervous/anxious.     Vital Signs: BP 122/74   Pulse 75   Temp 97.6 F (36.4 C)   Resp 16   Ht 5\' 7"  (1.702 m)   Wt 129 lb 9.6 oz (58.8 kg)   SpO2 97%   BMI 20.30 kg/m    Physical Exam Vitals and nursing note reviewed.  Constitutional:      General: She is not in acute distress.    Appearance: She is well-developed. She is not diaphoretic.  HENT:     Head: Normocephalic and atraumatic.     Mouth/Throat:     Pharynx: No oropharyngeal exudate.  Eyes:     Pupils: Pupils are equal, round, and reactive to light.  Neck:     Thyroid: No thyromegaly.     Vascular: No JVD.     Trachea: No tracheal deviation.  Cardiovascular:     Rate and Rhythm: Normal rate and regular rhythm.     Heart sounds: Normal heart sounds. No murmur heard.  No friction rub. No gallop.   Pulmonary:     Effort: Pulmonary effort is normal. No respiratory distress.     Breath sounds: Normal breath sounds. No wheezing or rales.  Chest:     Chest wall: No tenderness.  Abdominal:     Palpations: Abdomen is soft.     Tenderness: There is no abdominal tenderness. There is no guarding.  Musculoskeletal:        General: Normal range of motion.     Cervical back: Normal range of motion and neck supple.  Lymphadenopathy:     Cervical: No cervical adenopathy.  Skin:    General: Skin is warm and dry.  Neurological:     Mental Status: She is alert and oriented to person, place, and time.     Cranial Nerves: No cranial nerve deficit.  Psychiatric:        Behavior: Behavior normal.        Thought Content: Thought content normal.        Judgment: Judgment normal.    Assessment/Plan: 1. Elbow pain, right Refilled Mobic as discussed.  Take as needed.  - meloxicam (MOBIC) 15 MG tablet; Take 1 tablet (15 mg total) by mouth daily.  Dispense: 30 tablet; Refill:  1  2. Essential hypertension Good symptom management, continue diovan as directed.  - valsartan (DIOVAN) 160 MG tablet; Take 1 tablet (160 mg total) by mouth daily.  Dispense: 90 tablet; Refill: 1  3. Generalized anxiety disorder Continue to take lexapro.  Excellent symptom management.  - escitalopram (LEXAPRO) 10 MG tablet; Take 1 tablet (10 mg total) by mouth daily.  Dispense: 90 tablet; Refill: 1  General Counseling: temprance wyre understanding of the findings of todays visit and agrees with plan of treatment. I have discussed any further diagnostic evaluation that may be needed or ordered today. We also reviewed her medications today. she has been encouraged to call the office with any questions or concerns that should arise related to todays visit.    No orders of the defined types were placed in this encounter.   Meds ordered this encounter  Medications  . meloxicam (MOBIC) 15 MG tablet    Sig: Take 1 tablet (15 mg total) by mouth daily.    Dispense:  30 tablet    Refill:  1  . escitalopram (LEXAPRO) 10 MG tablet    Sig: Take 1 tablet (10 mg total) by mouth daily.    Dispense:  90 tablet    Refill:  1  . valsartan (DIOVAN) 160 MG tablet    Sig: Take 1 tablet (160 mg total) by mouth daily.    Dispense:  90 tablet    Refill:  1    Pt needs an appt for further refills    Time spent: 30 Minutes   This patient was seen by Blima Ledger AGNP-C in Collaboration with Dr Lyndon Code as a part of collaborative care agreement     Johnna Acosta AGNP-C Internal medicine

## 2020-02-26 NOTE — Telephone Encounter (Signed)
At check out patient did not want to scheduled follow up appointment is moving will call back to schedule. klh

## 2020-02-26 NOTE — Telephone Encounter (Signed)
Confirmed appointment on 02/26/2020 and screened for covid. klh
